# Patient Record
Sex: Male | Born: 1965 | Hispanic: No | Marital: Married | Smoking: Never smoker
Health system: Southern US, Community
[De-identification: ages and names within clinical notes are randomized; demographics above are authoritative.]

## PROBLEM LIST (undated history)

## (undated) ENCOUNTER — Emergency Department (HOSPITAL_COMMUNITY): Admission: EM | Payer: Medicare Other

## (undated) DIAGNOSIS — K469 Unspecified abdominal hernia without obstruction or gangrene: Secondary | ICD-10-CM

## (undated) DIAGNOSIS — E119 Type 2 diabetes mellitus without complications: Secondary | ICD-10-CM

## (undated) DIAGNOSIS — K219 Gastro-esophageal reflux disease without esophagitis: Secondary | ICD-10-CM

## (undated) DIAGNOSIS — I10 Essential (primary) hypertension: Secondary | ICD-10-CM

## (undated) HISTORY — DX: Essential (primary) hypertension: I10

## (undated) HISTORY — DX: Type 2 diabetes mellitus without complications: E11.9

## (undated) HISTORY — DX: Unspecified abdominal hernia without obstruction or gangrene: K46.9

## (undated) HISTORY — DX: Gastro-esophageal reflux disease without esophagitis: K21.9

---

## 2002-11-14 ENCOUNTER — Encounter: Payer: Self-pay | Admitting: Gastroenterology

## 2002-11-14 ENCOUNTER — Encounter: Admission: RE | Admit: 2002-11-14 | Discharge: 2002-11-14 | Payer: Self-pay | Admitting: Gastroenterology

## 2002-12-18 ENCOUNTER — Encounter: Payer: Self-pay | Admitting: Gastroenterology

## 2002-12-18 ENCOUNTER — Ambulatory Visit (HOSPITAL_COMMUNITY): Admission: RE | Admit: 2002-12-18 | Discharge: 2002-12-18 | Payer: Self-pay | Admitting: Gastroenterology

## 2005-09-08 IMAGING — RF DG SMALL BOWEL
6 series · 6 of 6 positions shown · non-contrast
Comparison: none

FINDINGS
CLINICAL DATA: LEFT LOWER QUADRANT PAIN.
DIAGNOSTIC SMALL BOWEL
KUB -- UNREMARKABLE EXCEPT FOR AN OVOID DENSITY IN THE RIGHT UPPER QUADRANT THAT IS CONSISTENT WITH
AN INGESTED PILL (THE PATIENT TOOK NEXIUM THIS MORNING).  OVERHEAD IMAGES WERE OBTAINED AS THE
BARIUM TRAVERSED THE SMALL BOWEL.  NO ANATOMICAL LESIONS OF THE JEJUNUM OR ILEUM NOTED.
FLUOROSCOPY SHOWS NORMAL PERISTALSIS.  SPOT FILMS OF THE TERMINAL ILEUM ARE NORMAL.
IMPRESSION
NORMAL EXAM.

[Series 1: run · 1 of 1 slices shown (1 of 6)]
[im 1/1]
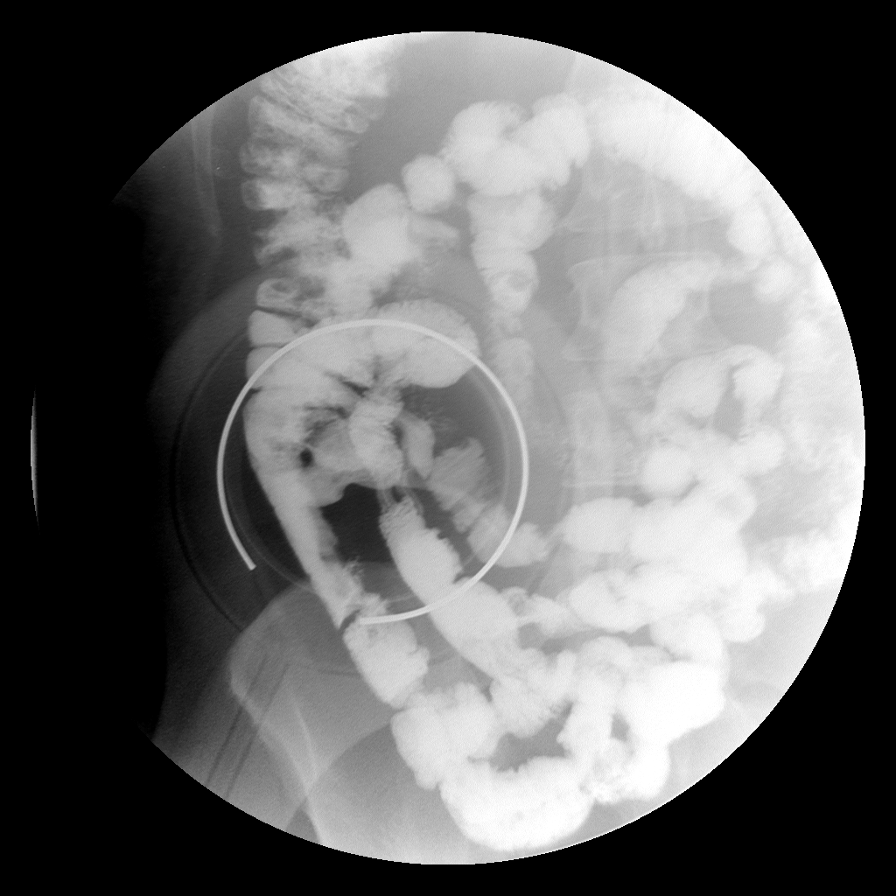

[Series 2: run · 1 of 1 slices shown (2 of 6)]
[im 1/1]
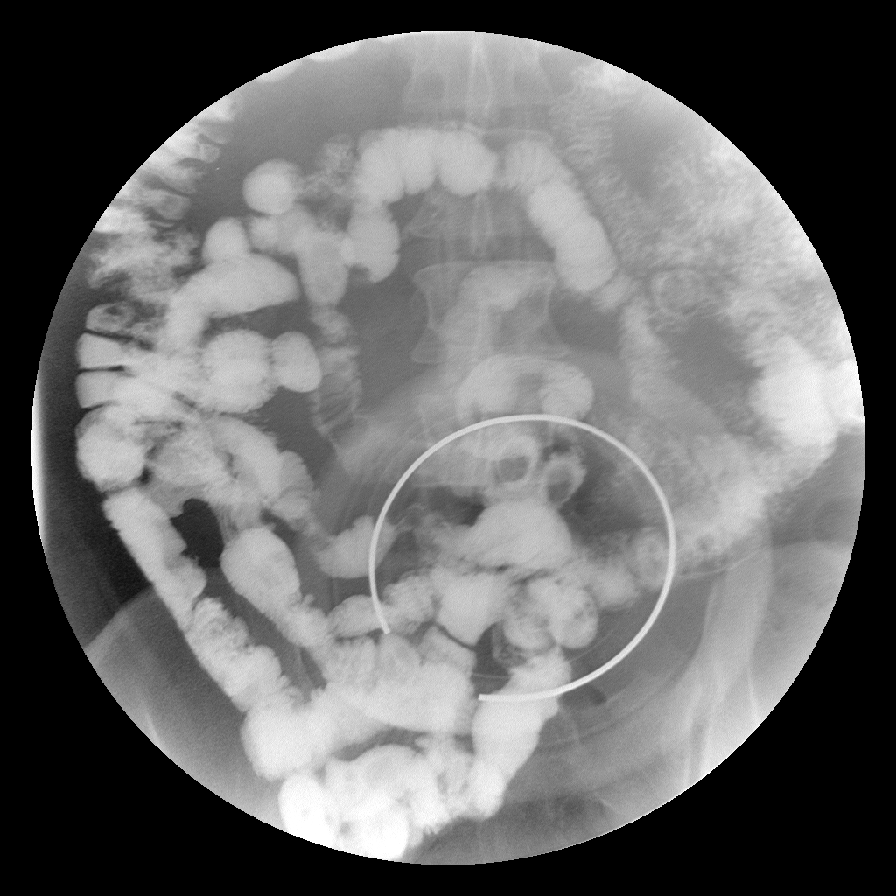

[Series 3: run · 1 of 1 slices shown (3 of 6)]
[im 1/1]
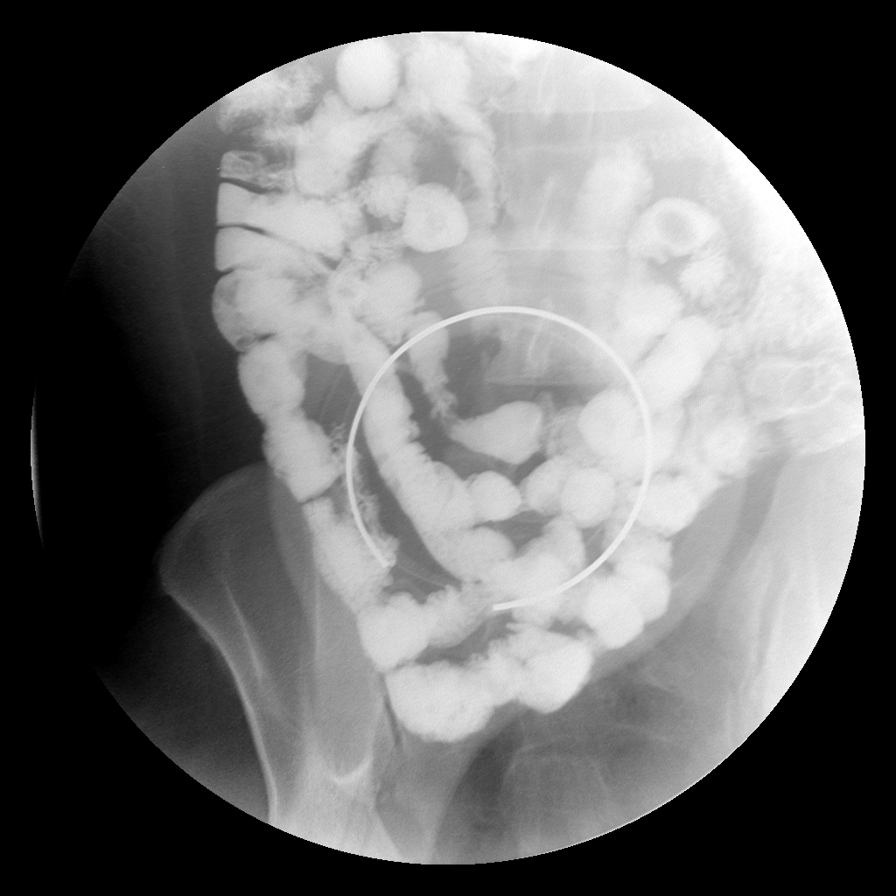

[Series 4: run · 1 of 1 slices shown (4 of 6)]
[im 1/1]
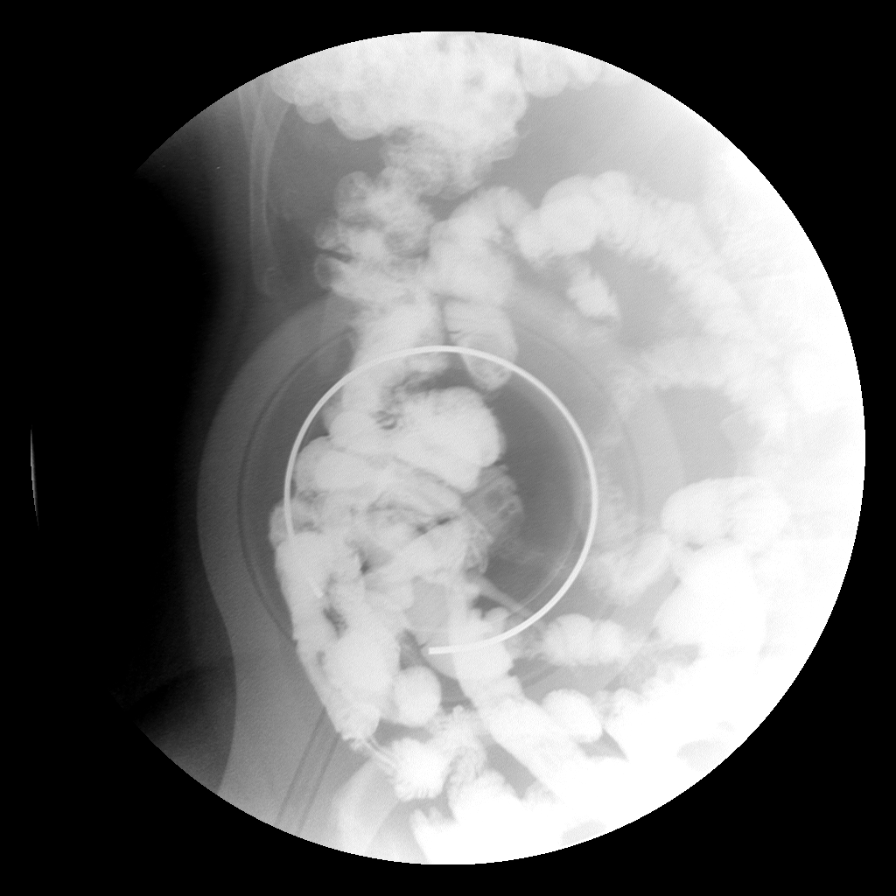

[Series 5: run · 1 of 1 slices shown (5 of 6)]
[im 1/1]
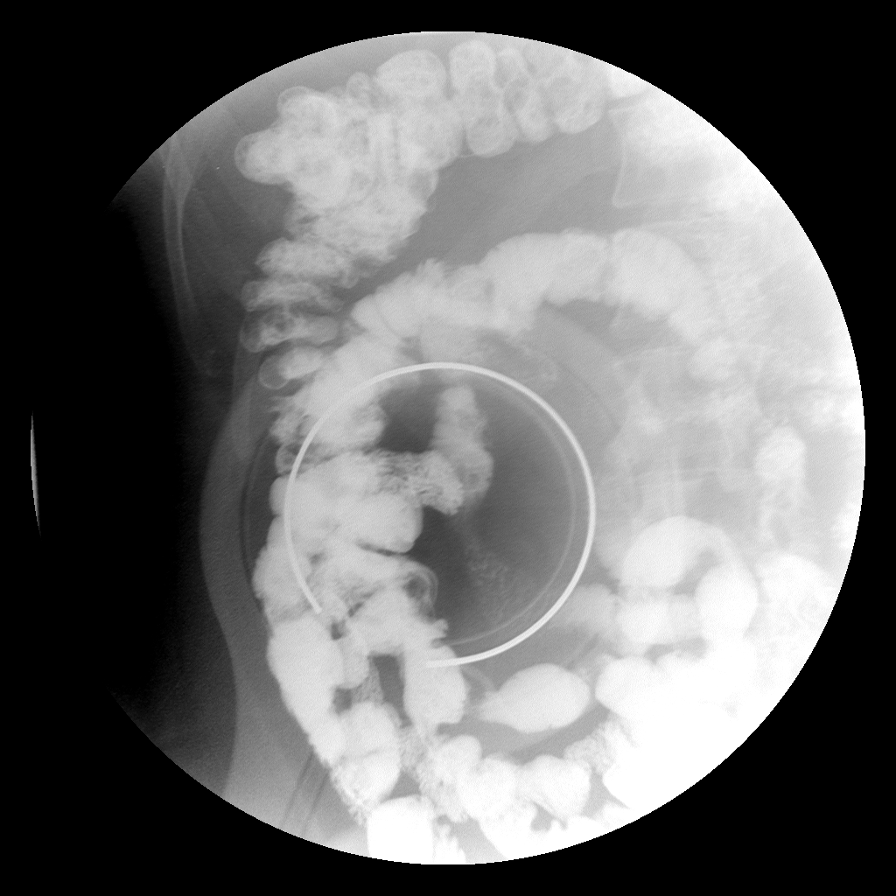

[Series 6: run · 1 of 1 slices shown (6 of 6)]
[im 1/1]
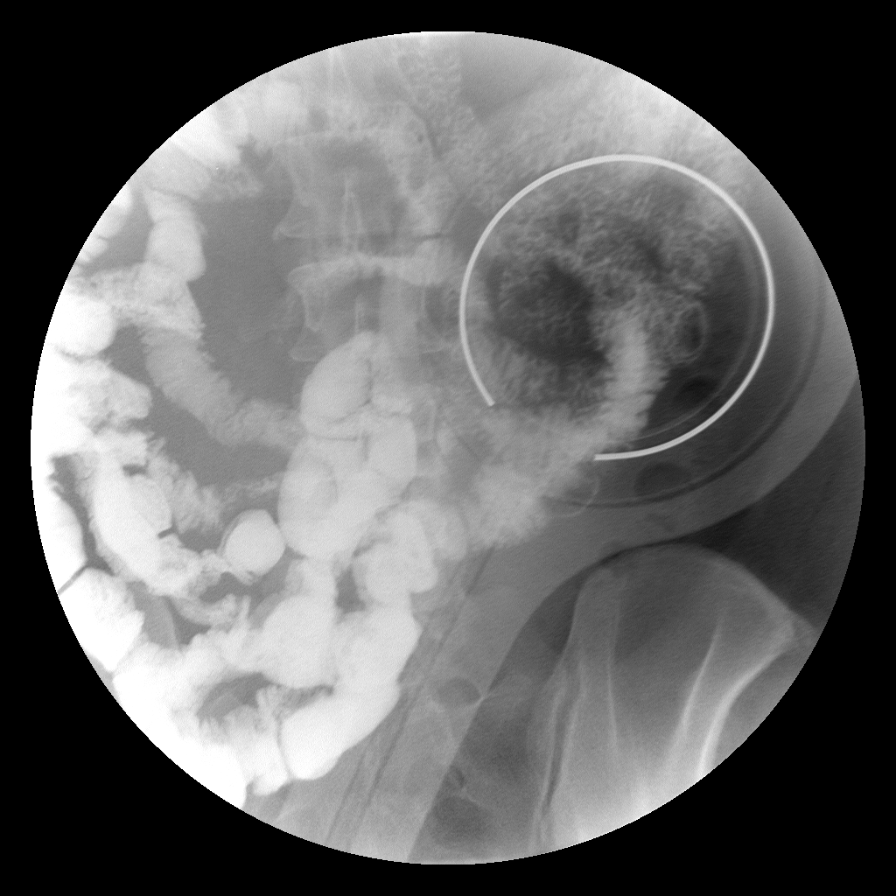

[6 of 6 positions shown; findings below may reference images not displayed]

## 2019-03-29 HISTORY — PX: HERNIA REPAIR: SHX51

## 2021-08-11 ENCOUNTER — Other Ambulatory Visit: Payer: Self-pay | Admitting: Gastroenterology

## 2021-08-11 DIAGNOSIS — R1033 Periumbilical pain: Secondary | ICD-10-CM

## 2021-08-24 ENCOUNTER — Ambulatory Visit
Admission: RE | Admit: 2021-08-24 | Discharge: 2021-08-24 | Disposition: A | Discharge status: Home / Self Care | Payer: Medicare Other | Source: Ambulatory Visit | Attending: Gastroenterology | Admitting: Gastroenterology

## 2021-08-24 DIAGNOSIS — R1033 Periumbilical pain: Secondary | ICD-10-CM

## 2021-08-24 MED ORDER — IOPAMIDOL (ISOVUE-300) INJECTION 61%
100.0000 mL | Freq: Once | INTRAVENOUS | Status: AC | PRN
  Administered 2021-08-24: 100 mL via INTRAVENOUS

## 2022-01-21 ENCOUNTER — Telehealth: Payer: Self-pay | Admitting: Gastroenterology

## 2022-01-21 NOTE — Telephone Encounter (Signed)
Good Morning Dr Lyndel Safe  We have received a request from patient to have his care transferred over from [facility] to our practice.  Patient states he has spoken with you previously and you told him to give [place] a call to possibly get schedule. Patient states his current Dr will be retiring in December of this year and also states that you both speak the same language.   I advised patient I would submit his transfer and you would advise me on scheduling. Patient s records are available for review I [facility]. Please review and advise on scheduling.  Thank you

## 2022-01-27 NOTE — Telephone Encounter (Signed)
Patient call to follow up on his transfer, States he spoke to you outside. Please advise on scheduling.

## 2022-01-27 NOTE — Telephone Encounter (Signed)
No problems. Can set him up in my clinic or APP clinic whichever is faster RG

## 2022-02-01 NOTE — Telephone Encounter (Signed)
Called patient home phone unable to leave voicemail.

## 2022-02-09 ENCOUNTER — Encounter: Payer: Self-pay | Admitting: Gastroenterology

## 2022-02-09 ENCOUNTER — Other Ambulatory Visit (INDEPENDENT_AMBULATORY_CARE_PROVIDER_SITE_OTHER): Payer: Medicare Other

## 2022-02-09 ENCOUNTER — Ambulatory Visit (INDEPENDENT_AMBULATORY_CARE_PROVIDER_SITE_OTHER): Payer: Medicare Other | Admitting: Gastroenterology

## 2022-02-09 VITALS — BP 118/84 | HR 76 | Ht 68.0 in | Wt 221.4 lb

## 2022-02-09 DIAGNOSIS — K219 Gastro-esophageal reflux disease without esophagitis: Secondary | ICD-10-CM

## 2022-02-09 DIAGNOSIS — K7460 Unspecified cirrhosis of liver: Secondary | ICD-10-CM

## 2022-02-09 DIAGNOSIS — K7581 Nonalcoholic steatohepatitis (NASH): Secondary | ICD-10-CM

## 2022-02-09 LAB — PROTIME-INR
INR: 1.2 ratio — ABNORMAL HIGH (ref 0.8–1.0)
Prothrombin Time: 12.6 s (ref 9.6–13.1)

## 2022-02-09 LAB — COMPREHENSIVE METABOLIC PANEL
ALT: 32 U/L (ref 0–53)
AST: 35 U/L (ref 0–37)
Albumin: 4 g/dL (ref 3.5–5.2)
Alkaline Phosphatase: 81 U/L (ref 39–117)
BUN: 7 mg/dL (ref 6–23)
CO2: 24 mEq/L (ref 19–32)
Calcium: 9 mg/dL (ref 8.4–10.5)
Chloride: 106 mEq/L (ref 96–112)
Creatinine, Ser: 0.73 mg/dL (ref 0.40–1.50)
GFR: 100.41 mL/min (ref >60.00)
Glucose, Bld: 67 mg/dL — ABNORMAL LOW (ref 70–99)
Potassium: 3.9 mEq/L (ref 3.5–5.1)
Sodium: 137 mEq/L (ref 135–145)
Total Bilirubin: 0.5 mg/dL (ref 0.2–1.2)
Total Protein: 7.3 g/dL (ref 6.0–8.3)

## 2022-02-09 LAB — CBC WITH DIFFERENTIAL/PLATELET
Basophils Absolute: 0 10*3/uL (ref 0.0–0.1)
Basophils Relative: 0.7 % (ref 0.0–3.0)
Eosinophils Absolute: 0.1 10*3/uL (ref 0.0–0.7)
Eosinophils Relative: 2.1 % (ref 0.0–5.0)
HCT: 38.1 % — ABNORMAL LOW (ref 39.0–52.0)
Hemoglobin: 12.4 g/dL — ABNORMAL LOW (ref 13.0–17.0)
Lymphocytes Relative: 26.7 % (ref 12.0–46.0)
Lymphs Abs: 1.4 10*3/uL (ref 0.7–4.0)
MCHC: 32.4 g/dL (ref 30.0–36.0)
MCV: 89.6 fl (ref 78.0–100.0)
Monocytes Absolute: 0.6 10*3/uL (ref 0.1–1.0)
Monocytes Relative: 11.3 % (ref 3.0–12.0)
Neutro Abs: 3.2 10*3/uL (ref 1.4–7.7)
Neutrophils Relative %: 59.2 % (ref 43.0–77.0)
Platelets: 111 10*3/uL — ABNORMAL LOW (ref 150.0–400.0)
RBC: 4.25 Mil/uL (ref 4.22–5.81)
RDW: 18.3 % — ABNORMAL HIGH (ref 11.5–15.5)
WBC: 5.4 10*3/uL (ref 4.0–10.5)

## 2022-02-09 LAB — LIPASE: Lipase: 35 U/L (ref 11.0–59.0)

## 2022-02-09 LAB — IBC + FERRITIN
Ferritin: 9.1 ng/mL — ABNORMAL LOW (ref 22.0–322.0)
Iron: 52 ug/dL (ref 42–165)
Saturation Ratios: 10.8 % — ABNORMAL LOW (ref 20.0–50.0)
TIBC: 480.2 ug/dL — ABNORMAL HIGH (ref 250.0–450.0)
Transferrin: 343 mg/dL (ref 212.0–360.0)

## 2022-02-09 LAB — MAGNESIUM: Magnesium: 1.9 mg/dL (ref 1.5–2.5)

## 2022-02-09 MED ORDER — PANTOPRAZOLE SODIUM 40 MG PO TBEC: 40.0000 mg | DELAYED_RELEASE_TABLET | Freq: Every day | ORAL | 4 refills | Status: DC

## 2022-02-09 NOTE — Progress Notes (Signed)
Chief Complaint:   Referring Provider:  No ref. provider found      ASSESSMENT AND PLAN;   #1. GERD  #2. Chronic L Abdo pain-likely musculoskeletal, neg CT AP for etiology.  #3. 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  No ascites or hepatic encephalopathy. No EV on EGD 11/2019  Plan: -Change omeprazole to protonix 40mg  po QD #90, 4RF -Stop Mg as he has occasional diarrhea. -CBC, CMP, AFP, PT INR, Mg, lipase, acute hep panel, iron studies, cerruloplasmin, ASMA, AMA -Check HBsAb titer and HAV total Ab.  If neg, would recommend vaccination for A and B. -Wt loss (aim 210 at follow-up) -RTC in March 2024. At FU, rpt April 2024 liver, EGD for EV screening.   HPI:    Mike Duncan is a 56 y.o. male  With HTN, DM 2, HLD, obesity, peripheral neuropathy  Chronic left-sided umbilical pain-likely musculoskeletal.  Better with heating pads.  He is also been using IcyHot spray.  He denies having any nausea, vomiting, heartburn (has occasional breakthrough symptoms on omeprazole/famotidine), regurgitation, odynophagia or dysphagia.  He denies having any significant constipation.  He does have occasional postprandial diarrhea, better with dicyclomine.  Had extensive GI evaluation as detailed below.  Was incidentally found to have NASH cirrhosis on CT.  No change in mental status.  No history of easy bruisability.  No nonsteroidals.  No esophageal varices on CT 2021.  He used to drink alcohol which he has quit completely over the last 6 months.  He has been trying very hard to reduce weight.  He has joined [employer].  Previous GI work-up: EGD 12/17/2019 -Mild esophageal candidiasis -Mild gastritis with negative biopsies for HP.  Colonoscopy 12/17/2019 -Negative except for hemorrhoids -Repeat colonoscopy in 10 years.  CT Abdo/pelvis with contrast 08/24/2021 -No evidence of recurrent ventral hernia or other acute findings. -Hepatic  cirrhosis and findings of portal venous hypertension. No evidence of hepatic neoplasm.  CT Abdo/pelvis with contrast 08/24/2021 1. Colonic diverticulosis without evidence of acute diverticulitis.  2. Small fat containing right inguinal hernia and tiny fluid containing periumbilical hernia.  3. Stable chronic splenomegaly.    Wt Readings from Last 3 Encounters:  02/09/22 221 lb 6.4 oz (100.4 kg)   SH- Mr 02/11/22 nephew.  Mother had throat CA.,  He is a Mike Duncan.  Past Medical History:  Diagnosis Date   Abdominal hernia    Diabetes (HCC)    GERD (gastroesophageal reflux disease)    Hypertension      Family History  Problem Relation Age of Onset   Diabetes Father        No current outpatient medications on file.   No current facility-administered medications for this visit.    Not on File  Review of Systems:  Constitutional: Denies fever, chills, diaphoresis, appetite change and fatigue.  HEENT: Denies photophobia, eye pain, redness, hearing loss, ear pain, congestion, sore throat, rhinorrhea, sneezing, mouth sores, neck pain, neck stiffness and tinnitus.   Respiratory: Denies SOB, DOE, cough, chest tightness,  and wheezing.   Cardiovascular: Denies chest pain, palpitations and leg swelling.  Genitourinary: Denies dysuria, urgency, frequency, hematuria, flank pain and difficulty urinating.  Musculoskeletal: Denies myalgias, back pain, joint swelling, arthralgias and gait problem.  Skin: No rash.  Neurological: Denies dizziness, seizures, syncope, weakness, light-headedness, numbness and headaches.  Hematological: Denies adenopathy. Easy bruising, personal or family bleeding history  Psychiatric/Behavioral: No anxiety or depression     Physical Exam:  BP 118/84   Pulse 76   Ht 5\' 8"  (1.727 m)   Wt 221 lb 6.4 oz (100.4 kg)   SpO2 99%   BMI 33.66 kg/m  Wt Readings from Last 3 Encounters:  02/09/22 221 lb 6.4 oz (100.4 kg)   Constitutional:  Well-developed,  in no acute distress. Psychiatric: Normal mood and affect. Behavior is normal. HEENT: Pupils normal.  Conjunctivae are normal. No scleral icterus. Cardiovascular: Normal rate, regular rhythm. No edema Pulmonary/chest: Effort normal and breath sounds normal. No wheezing, rales or rhonchi. Abdominal: Soft, nondistended. Nontender. Bowel sounds active throughout. There are no masses palpable. No hepatomegaly.  Small inguinal hernias Rectal: Deferred Neurological: Alert and oriented to person place and time. Skin: Skin is warm and dry. No rashes noted.    02/11/22, MD 02/09/2022, 12:01 PM  Cc: No ref. provider found

## 2022-02-09 NOTE — Patient Instructions (Addendum)
_______________________________________________________  If you are age 58 or older, your body mass index should be between 23-30. Your Body mass index is 33.66 kg/m. If this is out of the aforementioned range listed, please consider follow up with your Primary Care Provider.  If you are age 81 or younger, your body mass index should be between 19-25. Your Body mass index is 33.66 kg/m. If this is out of the aformentioned range listed, please consider follow up with your Primary Care Provider.   ________________________________________________________  The [place] providers would like to encourage you to use [facility] to communicate with providers for non-urgent requests or questions.  Due to long hold times on the telephone, sending your provider a message by [facility] may be a faster and more efficient way to get a response.  Please allow 48 business hours for a response.  Please remember that this is for non-urgent requests.  _______________________________________________________  Your provider has requested that you go to the basement level for lab work before leaving today. Press "B" on the elevator. The lab is located at the first door on the left as you exit the elevator.  We have sent the following medications to your pharmacy for you to pick up at your convenience: Protonix  Stop omeprazole Stop Magesium Stop pepcid  Try to lose weight gradually by walking daily if possible  Please follow up in 4 months. Give [place] a call at [phone] to schedule an appointment.  Thank you,  Dr. Lynann Bologna

## 2022-02-10 LAB — ACUTE VIRAL HEPATITIS (HAV, HBV, HCV)
HCV Ab: NONREACTIVE
Hep A IgM: NEGATIVE
Hep B C IgM: NEGATIVE
Hepatitis B Surface Ag: NEGATIVE

## 2022-02-10 LAB — HCV INTERPRETATION

## 2022-02-13 LAB — HEPATITIS A ANTIBODY, TOTAL: Hepatitis A AB,Total: REACTIVE — AB

## 2022-02-13 LAB — CERULOPLASMIN: Ceruloplasmin: 23 mg/dL (ref 18–36)

## 2022-02-13 LAB — ANTI-SMOOTH MUSCLE ANTIBODY, IGG: Actin (Smooth Muscle) Antibody (IGG): <20 U (ref <20)

## 2022-02-13 LAB — AFP TUMOR MARKER: AFP-Tumor Marker: 8.4 ng/mL — ABNORMAL HIGH (ref <6.1)

## 2022-02-13 LAB — ANA: Anti Nuclear Antibody (ANA): NEGATIVE

## 2022-02-13 LAB — HEPATITIS B SURFACE ANTIBODY,QUALITATIVE: Hep B S Ab: REACTIVE — AB

## 2022-02-13 NOTE — Progress Notes (Signed)
Please inform the patient. Immune to hepatitis A and B AFP is elevated. Also with IDA. Neg EGD/colon September 2021.  Plan: -MRI liver with contrast (R/O HCC) -Start iron supplements Hemocyte plus 1 tablet p.o. daily #30, 6 RF.  It may turn the stool dark. Send report to family physician

## 2022-02-14 ENCOUNTER — Other Ambulatory Visit: Payer: Self-pay

## 2022-02-14 DIAGNOSIS — K7460 Unspecified cirrhosis of liver: Secondary | ICD-10-CM

## 2022-02-14 DIAGNOSIS — D509 Iron deficiency anemia, unspecified: Secondary | ICD-10-CM

## 2022-02-14 MED ORDER — HEMOCYTE-PLUS 106-1 MG PO TABS: 1.0000 | ORAL_TABLET | Freq: Every day | ORAL | 6 refills | Status: DC

## 2022-02-23 ENCOUNTER — Ambulatory Visit (HOSPITAL_COMMUNITY)
Admission: RE | Admit: 2022-02-23 | Discharge: 2022-02-23 | Disposition: A | Discharge status: Home / Self Care | Payer: Medicare Other | Source: Ambulatory Visit | Attending: Gastroenterology | Admitting: Gastroenterology

## 2022-02-23 ENCOUNTER — Telehealth: Payer: Self-pay | Admitting: Gastroenterology

## 2022-02-23 DIAGNOSIS — K7460 Unspecified cirrhosis of liver: Secondary | ICD-10-CM | POA: Diagnosis present

## 2022-02-23 DIAGNOSIS — K7581 Nonalcoholic steatohepatitis (NASH): Secondary | ICD-10-CM | POA: Diagnosis present

## 2022-02-23 MED ORDER — GADOBUTROL 1 MMOL/ML IV SOLN
10.0000 mL | Freq: Once | INTRAVENOUS | Status: AC | PRN
  Administered 2022-02-23: 10 mL via INTRAVENOUS

## 2022-02-23 NOTE — Telephone Encounter (Signed)
Inbound call from patient stating that he wanted to make an appointment with Dr. Chales Abrahams. Patient stated that he would be leaving on December the 7th and would not be back until after March. Patient requested a call back to discuss if he can be seen before he leaves. Please advise.

## 2022-02-23 NOTE — Telephone Encounter (Signed)
Pt was requesting to have an appointment with Dr. Chales Abrahams prior to him leaving the country: Pt was scheduled for 02/24/2022 at 10:20: Pt made aware Pt verbalized understanding with all questions answered.

## 2022-02-24 ENCOUNTER — Ambulatory Visit (INDEPENDENT_AMBULATORY_CARE_PROVIDER_SITE_OTHER): Payer: Medicare Other | Admitting: Gastroenterology

## 2022-02-24 ENCOUNTER — Encounter: Payer: Self-pay | Admitting: Gastroenterology

## 2022-02-24 VITALS — BP 118/82 | HR 81 | Ht 68.0 in | Wt 221.0 lb

## 2022-02-24 DIAGNOSIS — K219 Gastro-esophageal reflux disease without esophagitis: Secondary | ICD-10-CM

## 2022-02-24 DIAGNOSIS — K7469 Other cirrhosis of liver: Secondary | ICD-10-CM

## 2022-02-24 DIAGNOSIS — R1030 Lower abdominal pain, unspecified: Secondary | ICD-10-CM

## 2022-02-24 DIAGNOSIS — R772 Abnormality of alphafetoprotein: Secondary | ICD-10-CM | POA: Diagnosis not present

## 2022-02-24 DIAGNOSIS — K7581 Nonalcoholic steatohepatitis (NASH): Secondary | ICD-10-CM

## 2022-02-24 DIAGNOSIS — K7460 Unspecified cirrhosis of liver: Secondary | ICD-10-CM

## 2022-02-24 MED ORDER — DICYCLOMINE HCL 20 MG PO TABS: 20.0000 mg | ORAL_TABLET | Freq: Three times a day (TID) | ORAL | 3 refills | Status: AC

## 2022-02-24 NOTE — Progress Notes (Signed)
Chief Complaint: FU  Referring Provider:  Dorothy Spark, FNP      ASSESSMENT AND PLAN;   #1. GERD  #2. Chronic L Abdo pain-likely musculoskeletal, neg CT AP for etiology.  #3. 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  No ascites or hepatic encephalopathy. No EV on EGD 11/2019  #3. Abn AFP 8.4 with MRI liver (Feb 23, 2022) showing 2 small lesions. Rpt MRI liver in 3-6 months. Pt going to [place]  Plan:  -MRI liver with contrast April/may 2024, after he is back from [place] -re-Check AFP, CBC, CMP at that time -Dicyclomine 20mg  TID.  #270, 4 RF. -Continue Protonix 40 mg p.o. daily. -FU thereafter. At FU, EGD for EV screening.    HPI:    Mike Duncan is a 56 y.o. male  With HTN, DM 2, HLD, obesity, peripheral neuropathy  Chronic left-sided umbilical pain-likely musculoskeletal.  Better with heating pads.  He is also been using IcyHot spray.  He denies having any nausea, vomiting, heartburn (has occasional breakthrough symptoms on omeprazole/famotidine), regurgitation, odynophagia or dysphagia.  He denies having any significant constipation.  He does have occasional postprandial diarrhea, better with dicyclomine.  Had extensive GI evaluation as detailed below.  Was incidentally found to have NASH cirrhosis on CT.  No change in mental status.  No history of easy bruisability.  No nonsteroidals.  No esophageal varices on CT 2021.  He used to drink alcohol which he has quit completely over the last 6 months.  He has been trying very hard to reduce weight.  He has joined [employer].  His alpha-fetoprotein was elevated at 8.4.  He underwent MRI of liver with contrast which showed 2 small lesions in the right lobe.  It was recommended to repeat MRI in 3 to 6 months.  Previous GI work-up: EGD 12/17/2019 -Mild esophageal candidiasis -Mild gastritis with negative biopsies for HP.  Colonoscopy 12/17/2019 -Negative  except for hemorrhoids -Repeat colonoscopy in 10 years.  CT Abdo/pelvis with contrast 08/24/2021 -No evidence of recurrent ventral hernia or other acute findings. -Hepatic cirrhosis and findings of portal venous hypertension. No evidence of hepatic neoplasm.  CT Abdo/pelvis with contrast 08/24/2021 1. Colonic diverticulosis without evidence of acute diverticulitis.  2. Small fat containing right inguinal hernia and tiny fluid containing periumbilical hernia.  3. Stable chronic splenomegaly.   MRI liver 02/23/2022 1. Morphologic changes in liver consistent cirrhosis. Splenomegaly noted. Venous collateralization better depicted on comparison CT. 2. Two indeterminate lesions in the RIGHT hepatic lobe. Categorize as LI-RADS 2 and 3. Recommend follow-up MRI. Hopefully patient will follow-up breath hold commands better on follow-up.   Wt Readings from Last 3 Encounters:  02/24/22 221 lb (100.2 kg)  02/09/22 221 lb 6.4 oz (100.4 kg)   SH- Mr Mike Duncan nephew.  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  Past Medical History:  Diagnosis Date   Abdominal hernia    Diabetes (HCC)    GERD (gastroesophageal reflux disease)    Hypertension      Family History  Problem Relation Age of Onset   Diabetes Father    Stomach cancer Neg Hx    Esophageal cancer Neg Hx    Colon cancer Neg Hx        Current Outpatient Medications  Medication Sig Dispense Refill   acetaminophen (TYLENOL 8 HOUR) 650 MG CR tablet Take 650 mg by mouth every 8 (eight) hours as needed for pain.  acidophilus (RISAQUAD) CAPS capsule Take 1 capsule by mouth daily.     AMBULATORY NON FORMULARY MEDICATION Take 1 tablet by mouth 2 (two) times daily. Medication Name: Janumet 50/100 mg     aspirin EC 81 MG tablet Take 81 mg by mouth daily. Swallow whole.     atorvastatin (LIPITOR) 80 MG tablet Take 80 mg by mouth at bedtime.     Ca & Phos-Vit D-Mag (POSTURE-D CALCIUM/MAGNESIUM PO) Take 1 tablet by mouth daily.      carvedilol (COREG) 6.25 MG tablet Take 6.25 mg by mouth 2 (two) times daily with a meal.     Cyanocobalamin (B-12) 2500 MCG TABS Take 1 tablet by mouth daily.     dicyclomine (BENTYL) 10 MG capsule Take 20 mg by mouth 3 (three) times daily.     empagliflozin (JARDIANCE) 25 MG TABS tablet Take 25 mg by mouth daily.     famotidine (PEPCID) 20 MG tablet Take 20 mg by mouth 2 (two) times daily.     Fe Fum-FA-B Cmp-C-Zn-Mg-Mn-Cu (HEMOCYTE-PLUS) 106-1 MG TABS Take 1 tablet by mouth daily. 30 tablet 6   finasteride (PROSCAR) 5 MG tablet Take 5 mg by mouth daily.     fluticasone (FLONASE) 50 MCG/ACT nasal spray Place 1 spray into both nostrils daily.     folic acid (FOLVITE) 1 MG tablet Take 1 mg by mouth daily.     gabapentin (NEURONTIN) 300 MG capsule Take 300 mg by mouth 3 (three) times daily.     glimepiride (AMARYL) 4 MG tablet Take 4 mg by mouth 2 (two) times daily.     magnesium oxide (MAG-OX) 400 (240 Mg) MG tablet Take 400 mg by mouth daily.     Multiple Vitamins-Minerals (CENTRUM SILVER PO) Take 1 tablet by mouth daily.     omeprazole (PRILOSEC) 40 MG capsule Take 40 mg by mouth daily.     pantoprazole (PROTONIX) 40 MG tablet Take 1 tablet (40 mg total) by mouth daily. 90 tablet 4   Polysaccharide Iron Complex (FERREX 150 PO) Take 1 tablet by mouth daily.     Simethicone 180 MG CAPS Take 1 capsule by mouth 3 (three) times daily.     tamsulosin (FLOMAX) 0.4 MG CAPS capsule Take 0.4 mg by mouth daily.     trimethoprim (TRIMPEX) 100 MG tablet Take 100 mg by mouth daily.     valsartan (DIOVAN) 160 MG tablet Take 160 mg by mouth daily.     Zinc 50 MG TABS Take 50 mg by mouth daily.     No current facility-administered medications for this visit.    Not on File  Review of Systems:  Constitutional: Denies fever, chills, diaphoresis, appetite change and fatigue.  HEENT: Denies photophobia, eye pain, redness, hearing loss, ear pain, congestion, sore throat, rhinorrhea, sneezing, mouth sores,  neck pain, neck stiffness and tinnitus.   Respiratory: Denies SOB, DOE, cough, chest tightness,  and wheezing.   Cardiovascular: Denies chest pain, palpitations and leg swelling.  Genitourinary: Denies dysuria, urgency, frequency, hematuria, flank pain and difficulty urinating.  Musculoskeletal: Denies myalgias, back pain, joint swelling, arthralgias and gait problem.  Skin: No rash.  Neurological: Denies dizziness, seizures, syncope, weakness, light-headedness, numbness and headaches.  Hematological: Denies adenopathy. Easy bruising, personal or family bleeding history  Psychiatric/Behavioral: No anxiety or depression     Physical Exam:    BP 118/82   Pulse 81   Ht 5\' 8"  (1.727 m)   Wt 221 lb (100.2 kg)   BMI 33.60  kg/m  Wt Readings from Last 3 Encounters:  02/24/22 221 lb (100.2 kg)  02/09/22 221 lb 6.4 oz (100.4 kg)   Constitutional:  Well-developed, in no acute distress. Psychiatric: Normal mood and affect. Behavior is normal. HEENT: Pupils normal.  Conjunctivae are normal. No scleral icterus. Cardiovascular: Normal rate, regular rhythm. No edema Pulmonary/chest: Effort normal and breath sounds normal. No wheezing, rales or rhonchi. Abdominal: Soft, nondistended. Nontender. Bowel sounds active throughout. There are no masses palpable. No hepatomegaly.  Small inguinal hernias Rectal: Deferred Neurological: Alert and oriented to person place and time. Skin: Skin is warm and dry. No rashes noted.    Carmell Austria, MD 02/24/2022, 10:41 AM  Cc: Dorothy Spark, FNP

## 2022-02-24 NOTE — Patient Instructions (Addendum)
_______________________________________________________  If you are age 58 or older, your body mass index should be between 23-30. Your Body mass index is 33.6 kg/m. If this is out of the aforementioned range listed, please consider follow up with your Primary Care Provider.  If you are age 41 or younger, your body mass index should be between 19-25. Your Body mass index is 33.6 kg/m. If this is out of the aformentioned range listed, please consider follow up with your Primary Care Provider.   ________________________________________________________  The [facility] providers would like to encourage you to use [facility] to communicate with providers for non-urgent requests or questions.  Due to long hold times on the telephone, sending your provider a message by [facility] may be a faster and more efficient way to get a response.  Please allow 48 business hours for a response.  Please remember that this is for non-urgent requests.  _______________________________________________________  We have sent the following medications to your pharmacy for you to pick up at your convenience: Bentyl 3 times a day  Please call in April to schedule your MRI at [facility] by calling [phone]. Please get lab work done on the basement level of the GI office a week prior to the test. Please  call [phone] to schedule a follow up appointment after your MRI date. Labs and order has been placed  Please call with any questions or concerns.  Thank you,  Dr. Lynann Bologna

## 2022-05-24 ENCOUNTER — Encounter: Payer: Self-pay | Admitting: Gastroenterology

## 2022-07-13 ENCOUNTER — Ambulatory Visit: Payer: 59 | Admitting: Gastroenterology

## 2022-09-19 ENCOUNTER — Encounter: Payer: Self-pay | Admitting: Gastroenterology

## 2022-09-19 ENCOUNTER — Ambulatory Visit (INDEPENDENT_AMBULATORY_CARE_PROVIDER_SITE_OTHER): Payer: 59 | Admitting: Gastroenterology

## 2022-09-19 VITALS — BP 120/80 | HR 92 | Ht 66.0 in | Wt 211.2 lb

## 2022-09-19 DIAGNOSIS — K219 Gastro-esophageal reflux disease without esophagitis: Secondary | ICD-10-CM

## 2022-09-19 DIAGNOSIS — R772 Abnormality of alphafetoprotein: Secondary | ICD-10-CM

## 2022-09-19 DIAGNOSIS — K7581 Nonalcoholic steatohepatitis (NASH): Secondary | ICD-10-CM

## 2022-09-19 DIAGNOSIS — K7460 Unspecified cirrhosis of liver: Secondary | ICD-10-CM

## 2022-09-19 DIAGNOSIS — R1030 Lower abdominal pain, unspecified: Secondary | ICD-10-CM | POA: Diagnosis not present

## 2022-09-19 NOTE — Patient Instructions (Signed)
_______________________________________________________  If your blood pressure at your visit was 140/90 or greater, please contact your primary care physician to follow up on this.  _______________________________________________________  If you are age 59 or older, your body mass index should be between 23-30. Your Body mass index is 34.1 kg/m. If this is out of the aforementioned range listed, please consider follow up with your Primary Care Provider.  If you are age 69 or younger, your body mass index should be between 19-25. Your Body mass index is 34.1 kg/m. If this is out of the aformentioned range listed, please consider follow up with your Primary Care Provider.   ________________________________________________________  The [facility] providers would like to encourage you to use [facility] to communicate with providers for non-urgent requests or questions.  Due to long hold times on the telephone, sending your provider a message by [facility] may be a faster and more efficient way to get a response.  Please allow 48 business hours for a response.  Please remember that this is for non-urgent requests.  _______________________________________________________  Please follow up in 6 months. Give [place] a call at [phone] to schedule an appointment.  Thank you,  Dr. Lynann Bologna

## 2022-09-19 NOTE — Progress Notes (Signed)
Chief Complaint: FU  Referring Provider:  Yolanda Manges, FNP      ASSESSMENT AND PLAN;   #1. GERD  #2. Chronic L Abdo pain-likely musculoskeletal, neg CT AP for etiology.  #3. MASLD cirrhosis with pHTN (mild splenomegaly with thrombocytopenia, recannulization of that umbilical veins).  Minimal ascites, no hepatic encephalopathy. No EV on EGD 11/2019. Followed by [facility])  #3. Abn AFP 8.4 with MRI liver (Feb 23, 2022) showing 2 small lesions. Rpt MRI liver with contrast 09/01/2022: neg for any obvious HCC.  Small benign lesion in the liver.  Still advised to repeat in 6 months.  Most recent AFP 7.5 (08/03/2022)  Plan:  -EGD/colon October 12, 2022 by Dr Mike Duncan -Rpt MRI with contrast Dec 2024 as suggented by [facility] ( -Low salt diet -Dicyclomine 20mg  TID.  #270, 4 RF. -Continue omeprazole 40 mg p.o. daily. -Continue to follow with Dr Mike Duncan and Mike Malay PA. -FU in 6 months    HPI:    Mike Duncan is a 57 y.o. male  With HTN, DM2, HLD, obesity, peripheral neuropathy, COPD, CHD, PAD, OA  FU MASLD liver cirrhosis with pHTN.  Most recent ultrasound showed minimal ascites.  Followed by Mike Malay, PA ([facility])  Underwent CT Abdo/pelvis with contrast 07/01/2022 showing changes of cirrhosis with portal hypertension, mild splenomegaly, small volume ascites.  It also showed mild wall thickening of descending and transverse duodenum likely related to portal hypertension.  Wall thickening of cecum, proximal ascending colon which could also be nonspecific colitis or related to portal hypertension.  Advised EGD/colonoscopy  He is scheduled to have EGD/colonoscopy by Dr. Katrinka Duncan on October 12, 2022  Also had MRI Abdo with contrast 09/01/2022 showing stable liver cirrhosis with splenomegaly and trace ascites.  1 cm posterior right hepatic nonenhancing lesion most likely benign.  Ultrasound Abdo 08/15/2022 showed minimal ascites.  Too  little for tap  Overall he has done fairly well except for chronic left-sided abdominal pain.  This does get better with heating pads.  Likely etiology is musculoskeletal.  Highly unlikely to be due to adhesions.   Is currently under multiple physicians-including gastroenterologists, surgeons and most recently liver clinic.  His recent trip to [place]-had PET CT which apparently showed mesenteric nodules.  He was advised laparoscopic bx which she declined.  Dr. Katrinka Duncan @[place]  has sent those films to be reviewed.  I will advised him to follow-up with liver clinic, Dr. Katrinka Duncan at [facility].  He can follow-up here on as-needed basis  No alcohol.  Has been trying to reduce weight and has been able to reduce 10 pounds over last several months.  Wt Readings from Last 3 Encounters:  09/19/22 211 lb 4 oz (95.8 kg)  02/24/22 221 lb (100.2 kg)  02/09/22 221 lb 6.4 oz (100.4 kg)   Denies having any jaundice dark urine or pale stools.  No change in mental status.  He denies having any nausea, vomiting, heartburn (has occasional breakthrough symptoms on omeprazole/famotidine), regurgitation, odynophagia or dysphagia.  He denies having any significant constipation.  He does have occasional postprandial diarrhea, better with dicyclomine.  His alpha-fetoprotein was elevated at 8.4.  He underwent MRI of liver with contrast which showed 2 small lesions in the right lobe.  It was recommended to repeat MRI in 3 to 6 months.  Repeat MRI was negative.  Most recent AFP 7.5   Wt Readings from Last 3 Encounters:  09/19/22 211 lb 4 oz (95.8  kg)  02/24/22 221 lb (100.2 kg)  02/09/22 221 lb 6.4 oz (100.4 kg)    Previous GI work-up: EGD 12/17/2019 -Mild esophageal candidiasis -Mild gastritis with negative biopsies for HP.  Colonoscopy 12/17/2019 -Negative except for hemorrhoids -Repeat colonoscopy in 10 years.  CT Abdo/pelvis with contrast 08/24/2021 -No evidence of recurrent ventral hernia or other acute  findings. -Hepatic cirrhosis and findings of portal venous hypertension. No evidence of hepatic neoplasm.  CT Abdo/pelvis with contrast 08/24/2021 1. Colonic diverticulosis without evidence of acute diverticulitis.  2. Small fat containing right inguinal hernia and tiny fluid containing periumbilical hernia.  3. Stable chronic splenomegaly.   MRI liver 02/23/2022 1. Morphologic changes in liver consistent cirrhosis. Splenomegaly noted. Venous collateralization better depicted on comparison CT. 2. Two indeterminate lesions in the RIGHT hepatic lobe. Categorize as LI-RADS 2 and 3. Recommend follow-up MRI. Hopefully patient will follow-up breath hold commands better on follow-up.  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  MELD 3.0: 8 at 02/09/2022 12:58 PM MELD-Na: 8 at 02/09/2022 12:58 PM Calculated from: Serum Creatinine: 0.73 mg/dL (Using min of 1 mg/dL) at 86/57/8469 62:95 PM Serum Sodium: 137 mEq/L at 02/09/2022 12:58 PM Total Bilirubin: 0.5 mg/dL (Using min of 1 mg/dL) at 28/41/3244 01:02 PM Serum Albumin: 4.0 g/dL (Using max of 3.5 g/dL) at 72/53/6644 03:47 PM INR(ratio): 1.2 ratio at 02/09/2022 12:58 PM Age at listing (hypothetical): 61 years Sex: Male at 02/09/2022 12:58 PM     Wt Readings from Last 3 Encounters:  09/19/22 211 lb 4 oz (95.8 kg)  02/24/22 221 lb (100.2 kg)  02/09/22 221 lb 6.4 oz (100.4 kg)   SH- Mr Mike Duncan nephew.  Mother had throat CA.,  He is a Firefighter.  Past Medical History:  Diagnosis Date   Abdominal hernia    Diabetes (HCC)    GERD (gastroesophageal reflux disease)    Hypertension      Family History  Problem Relation Age of Onset   Diabetes Father    Stomach cancer Neg Hx    Esophageal cancer Neg Hx    Colon cancer Neg Hx     Social History   Tobacco Use   Smoking status: Never   Smokeless tobacco: Current     Types: Chew  Vaping Use   Vaping Use: Never used  Substance Use Topics   Alcohol use: Not Currently   Drug use: Never    Current Outpatient Medications  Medication Sig Dispense Refill   acetaminophen (TYLENOL 8 HOUR) 650 MG CR tablet Take 650 mg by mouth every 8 (eight) hours as needed for pain.     acidophilus (RISAQUAD) CAPS capsule Take 1 capsule by mouth daily.     aspirin EC 81 MG tablet Take 81 mg by mouth daily. Swallow whole.     atorvastatin (LIPITOR) 80 MG tablet Take 80 mg by mouth at bedtime.     b complex vitamins capsule Take 1 capsule by mouth daily.     Ca & Phos-Vit D-Mag (POSTURE-D CALCIUM/MAGNESIUM PO) Take 1 tablet by mouth daily.     carvedilol (COREG) 6.25 MG tablet Take 6.25 mg by mouth 2 (two) times daily with a meal.     Cholecalciferol (VITAMIN D3) 50 MCG (2000 UT) capsule Take 2,000 Units by mouth daily.     clobetasol ointment (TEMOVATE) 0.05 % Apply 1 Application topically daily.     Cyanocobalamin 1000 MCG CAPS  Take 1 capsule by mouth daily.     dicyclomine (BENTYL) 20 MG tablet Take 1 tablet (20 mg total) by mouth 3 (three) times daily. 270 tablet 3   empagliflozin (JARDIANCE) 25 MG TABS tablet Take 25 mg by mouth daily.     finasteride (PROSCAR) 5 MG tablet Take 5 mg by mouth daily.     fluticasone (FLONASE) 50 MCG/ACT nasal spray Place 1 spray into both nostrils daily.     gabapentin (NEURONTIN) 600 MG tablet Take 600 mg by mouth 3 (three) times daily.     glimepiride (AMARYL) 4 MG tablet Take 2 mg by mouth daily.     hydrocortisone (ANUSOL-HC) 2.5 % rectal cream Place 1 Application rectally 2 (two) times daily. as directed     magnesium oxide (MAG-OX) 400 (240 Mg) MG tablet Take 400 mg by mouth daily.     metFORMIN (GLUCOPHAGE) 1000 MG tablet Take 1,000 mg by mouth 2 (two) times daily with a meal.     Multiple Vitamins-Minerals (CENTRUM SILVER PO) Take 1 tablet by mouth daily.     nystatin (MYCOSTATIN) 100000 UNIT/ML suspension Take 5 mLs by mouth 2  (two) times daily. Swish and spit     omeprazole (PRILOSEC) 40 MG capsule Take 40 mg by mouth daily.     OZEMPIC, 0.25 OR 0.5 MG/DOSE, 2 MG/3ML SOPN Inject 0.5 mg into the skin once a week.     Polysaccharide Iron Complex (FERREX 150 PO) Take 1 tablet by mouth daily.     Simethicone 180 MG CAPS Take 1 capsule by mouth 3 (three) times daily.     sucralfate (CARAFATE) 1 GM/10ML suspension Take 1 g by mouth 3 (three) times daily.     tacrolimus (PROGRAF) 1 MG capsule Dissolve 1 cap in 1/2 liter of water/swish 2 minutes and spit 2 x/day     tamsulosin (FLOMAX) 0.4 MG CAPS capsule Take 0.4 mg by mouth daily.     trimethoprim (TRIMPEX) 100 MG tablet Take 100 mg by mouth daily.     valsartan (DIOVAN) 160 MG tablet Take 160 mg by mouth daily.     Zinc 50 MG TABS Take 50 mg by mouth daily.     No current facility-administered medications for this visit.    No Known Allergies  Review of Systems:  Constitutional: Denies fever, chills, diaphoresis, appetite change and fatigue.  HEENT: Denies photophobia, eye pain, redness, hearing loss, ear pain, congestion, sore throat, rhinorrhea, sneezing, mouth sores, neck pain, neck stiffness and tinnitus.   Respiratory: Denies SOB, DOE, cough, chest tightness,  and wheezing.   Cardiovascular: Denies chest pain, palpitations and leg swelling.  Genitourinary: Denies dysuria, urgency, frequency, hematuria, flank pain and difficulty urinating.  Musculoskeletal: Denies myalgias, back pain, joint swelling, arthralgias and gait problem.  Skin: No rash.  Neurological: Denies dizziness, seizures, syncope, weakness, light-headedness, numbness and headaches.  Hematological: Denies adenopathy. Easy bruising, personal or family bleeding history  Psychiatric/Behavioral: No anxiety or depression     Physical Exam:    BP 120/80 (BP Location: Left Arm, Patient Position: Sitting, Cuff Size: Normal)   Pulse 92   Ht 5\' 6"  (1.676 m)   Wt 211 lb 4 oz (95.8 kg)   BMI 34.10  kg/m  Wt Readings from Last 3 Encounters:  09/19/22 211 lb 4 oz (95.8 kg)  02/24/22 221 lb (100.2 kg)  02/09/22 221 lb 6.4 oz (100.4 kg)   Constitutional:  Well-developed, in no acute distress. Psychiatric: Normal mood and affect. Behavior is  normal. HEENT: Pupils normal.  Conjunctivae are normal. No scleral icterus. Cardiovascular: Normal rate, regular rhythm. No edema Pulmonary/chest: Effort normal and breath sounds normal. No wheezing, rales or rhonchi. Abdominal: Soft, nondistended. Nontender. Bowel sounds active throughout. There are no masses palpable. No hepatomegaly.  Small inguinal hernias Rectal: Deferred Neurological: Alert and oriented to person place and time. Skin: Skin is warm and dry. No rashes noted.    Edman Circle, MD 09/19/2022, 3:46 PM  Cc: Mike Manges, FNP

## 2023-07-06 ENCOUNTER — Telehealth: Payer: Self-pay | Admitting: Gastroenterology

## 2023-07-06 NOTE — Telephone Encounter (Signed)
 Inbound call from patient stating over the last few days he has been experiencing a lot of blood from his rectum and then he will pass stool. Patient is requesting a call back to discuss and to see if he can be seen ASAP. Please advise.

## 2023-07-06 NOTE — Telephone Encounter (Signed)
 Patient called in with concerns for rectal bleeding & requesting to be seen for OV. He was last seen here for OV on 08/2022 with Dr. Chales Abrahams, and since then is being followed with [facility]. He was seen yesterday for an OV with their office & was told he had hemorrhoids. He has called their office today & spoken with a nurse who will be reaching out to the MD. Pt advised that since he is currently followed with [facility] & was seen there yesterday, he needs to wait and hear back from his doctor for further recommendations. I did discuss ED precautions with him in the meantime while he waits to hear back. Pt verbalized all understanding.

## 2024-05-15 IMAGING — CT CT ABD-PELV W/ CM
1 of 3 series · 14 of 32 positions shown, 19 images · IV contrast (agent unspecified)
Comparison: 03/31/2021 from [facility] Sabino

CLINICAL DATA: Paraumbilical abdominal pain for 3 weeks. Previous
umbilical hernia repair.

EXAM:
CT ABDOMEN AND PELVIS WITH CONTRAST
TECHNIQUE: Multidetector CT imaging of the abdomen and pelvis was performed
using the standard protocol following bolus administration of
intravenous contrast.

[Series 2: a/p w/ 5mm · axial · 0.88mm/px · z∈[-504,+6]mm · 14 of 115 slices shown, 19 images]
[im 7/115  soft-tissue]
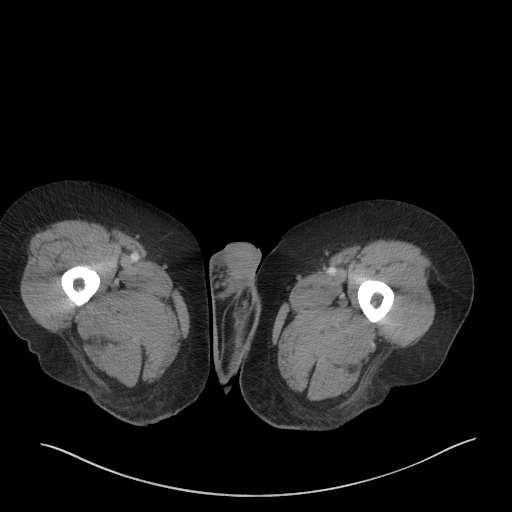
[im 7/115  bone]
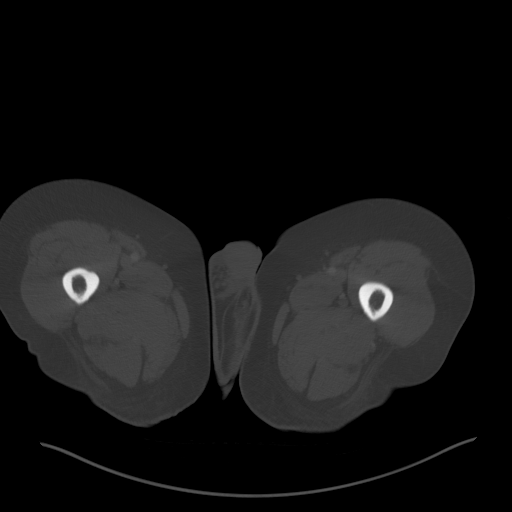
[im 19/115  soft-tissue]
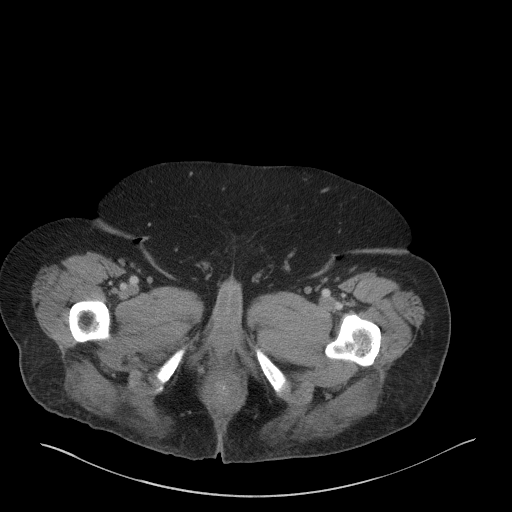
[im 25/115  soft-tissue]
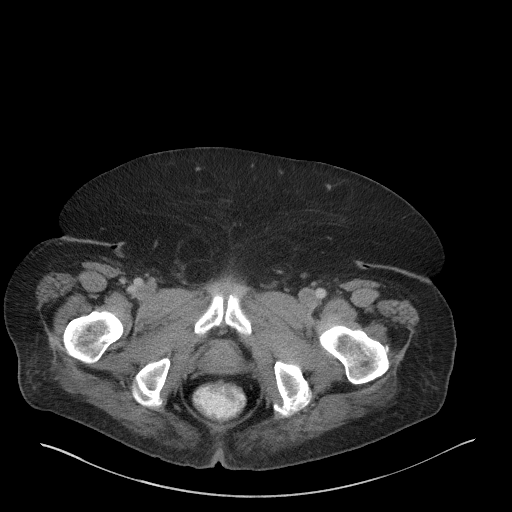
[im 31/115  soft-tissue]
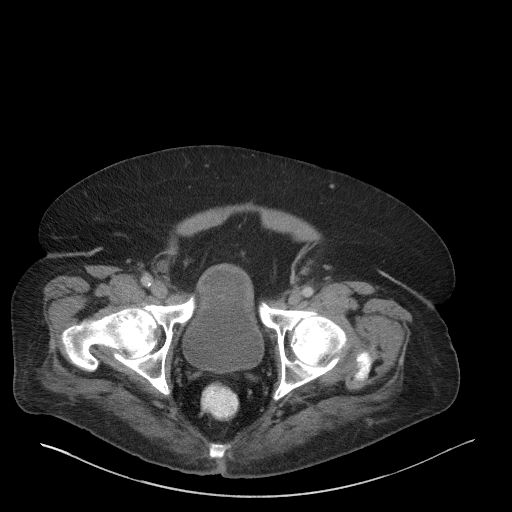
[im 43/115  soft-tissue]
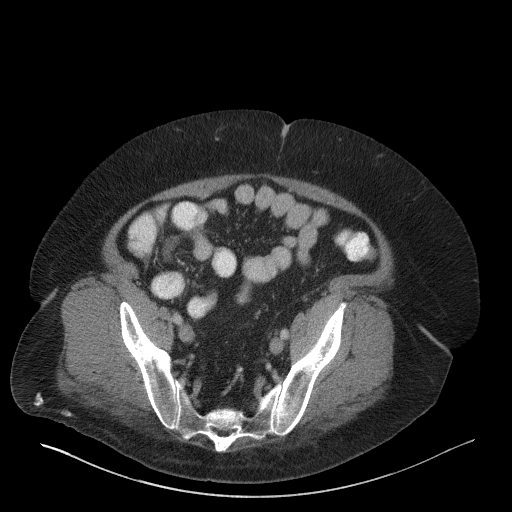
[im 49/115  soft-tissue]
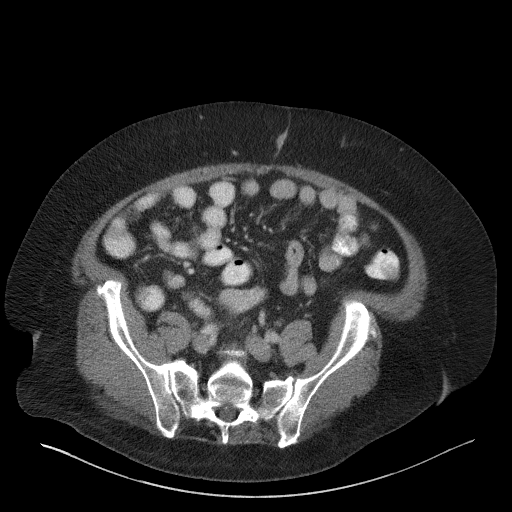
[im 61/115  soft-tissue]
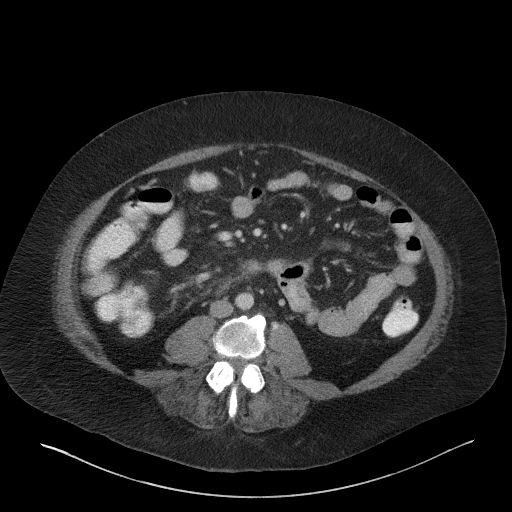
[im 67/115  soft-tissue]
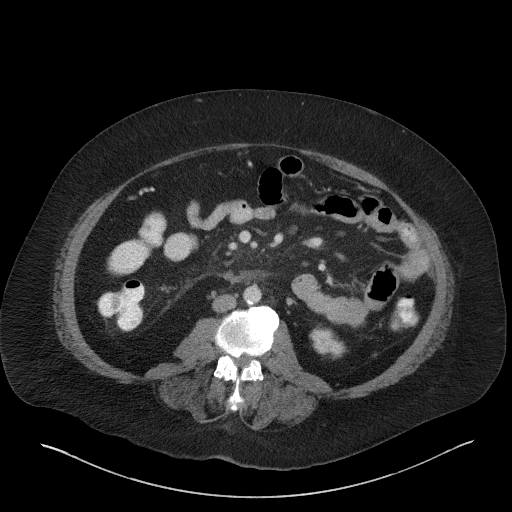
[im 73/115  soft-tissue]
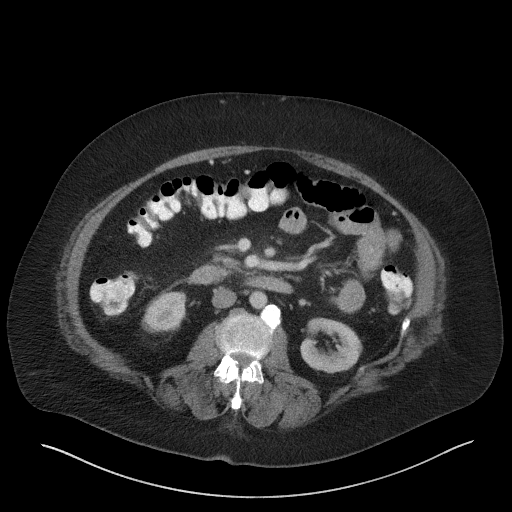
[im 73/115  bone]
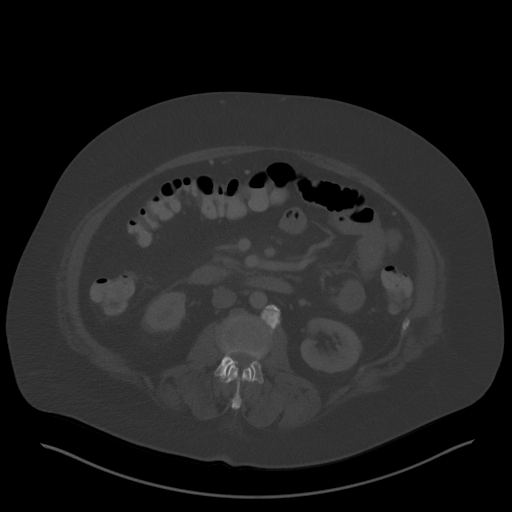
[im 85/115  soft-tissue]
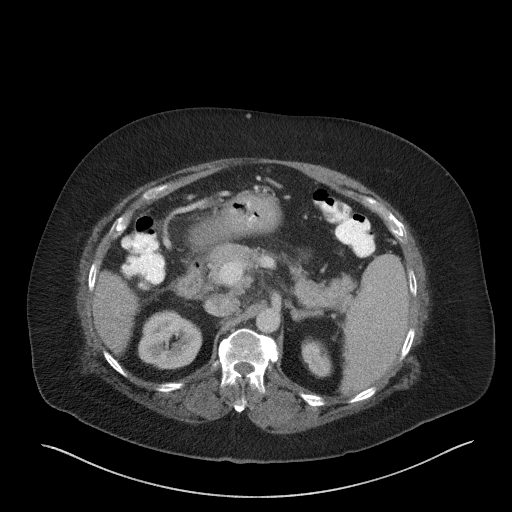
[im 91/115  soft-tissue]
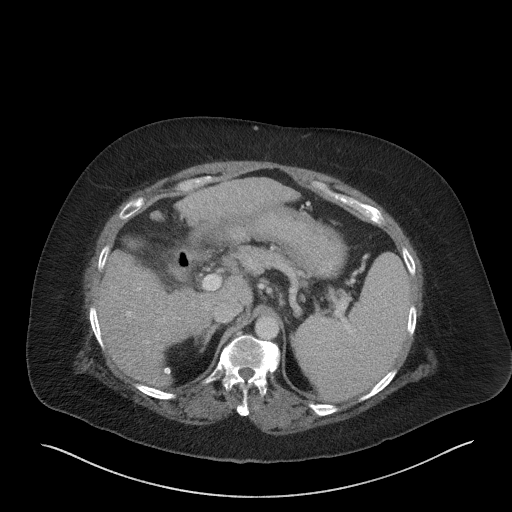
[im 91/115  lung]
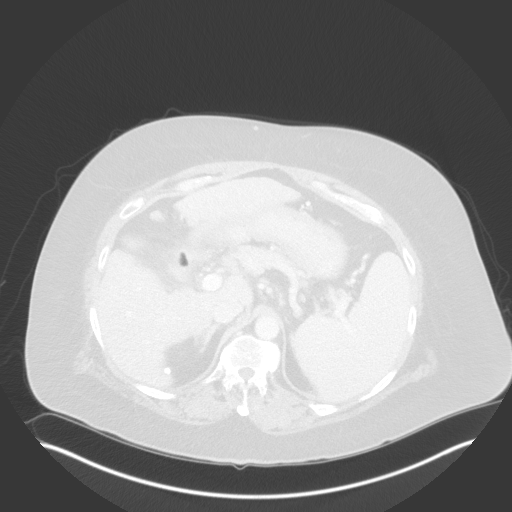
[im 97/115  soft-tissue]
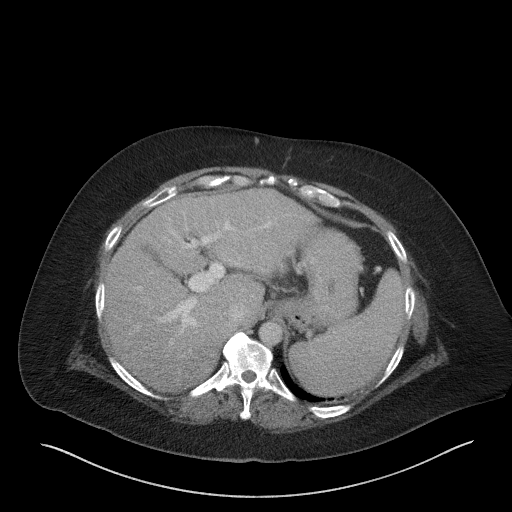
[im 97/115  lung]
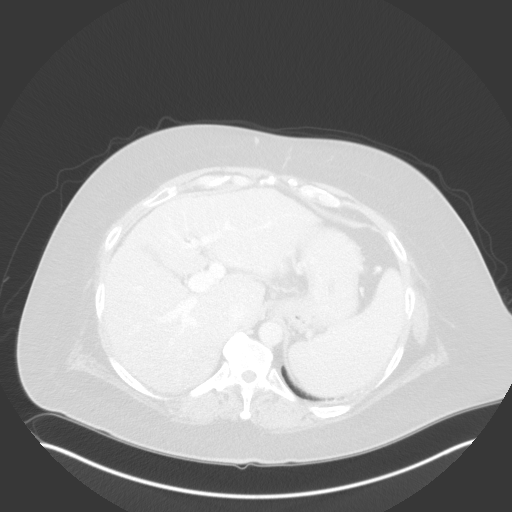
[im 103/115  lung]
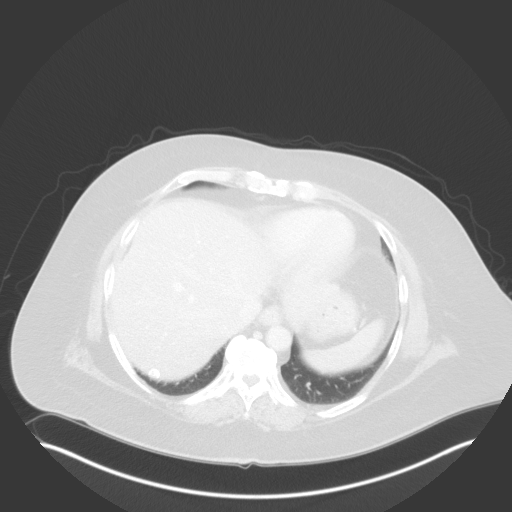
[im 109/115  soft-tissue]
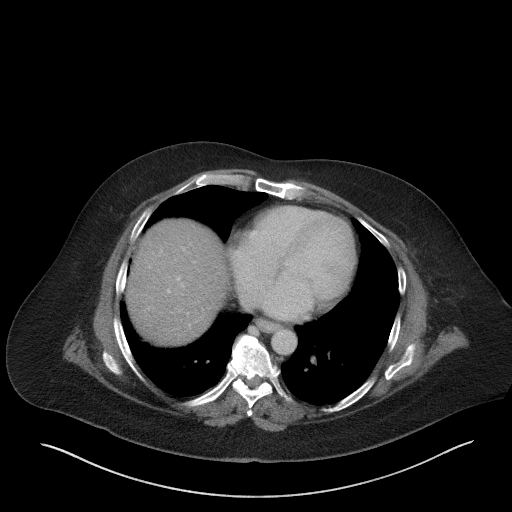
[im 109/115  lung]
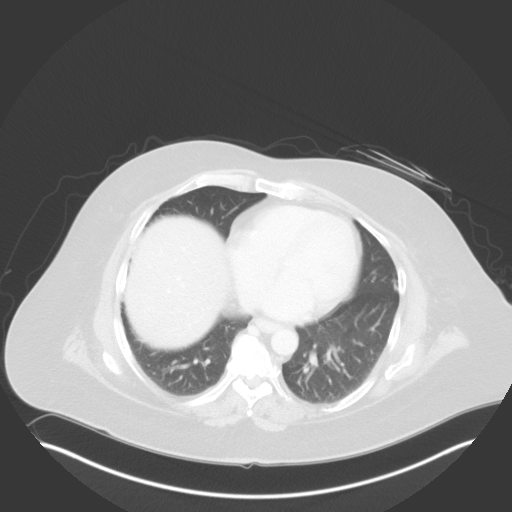

[14 of 32 positions shown; findings below may reference images not displayed]

RADIATION DOSE REDUCTION: This exam was performed according to the
departmental dose-optimization program which includes automated
exposure control, adjustment of the mA and/or kV according to
patient size and/or use of iterative reconstruction technique.

CONTRAST:  100mL VZVEFZ-011 IOPAMIDOL (VZVEFZ-011) INJECTION 61%
FINDINGS: Lower Chest: No acute findings.

Hepatobiliary: Hepatic cirrhosis is demonstrated. No hepatic masses
are identified. Recanalization of paraumbilical veins is consistent
with portal venous hypertension. Gallbladder is unremarkable. No
evidence of biliary ductal dilatation.

Pancreas:  No mass or inflammatory changes.

Spleen: Borderline splenomegaly, measuring 13-14 cm in length. No
splenic masses identified.

Adrenals/Urinary Tract: No masses identified. No evidence of
ureteral calculi or hydronephrosis.

Stomach/Bowel: No evidence of obstruction, inflammatory process or
abnormal fluid collections. Normal appendix visualized.

Vascular/Lymphatic: No pathologically enlarged lymph nodes. No acute
vascular findings. Portosystemic venous collaterals in the
gastrohepatic and gastrosplenic ligaments is consistent with portal
venous hypertension.

Reproductive:  No mass or other significant abnormality.

Other: Mild mesenteric edema, without evidence of ascites. No
evidence of recurrent umbilical or other ventral abdominal wall
hernia.

Musculoskeletal:  No suspicious bone lesions identified.
IMPRESSION: No evidence of recurrent ventral hernia or other acute findings.

Hepatic cirrhosis and findings of portal venous hypertension. No
evidence of hepatic neoplasm.
